# Patient Record
Sex: Male | Born: 2005 | Race: White | Hispanic: No | Marital: Single | Smoking: Never smoker
Health system: Southern US, Community
[De-identification: ages and names within clinical notes are randomized; demographics above are authoritative.]

## PROBLEM LIST (undated history)

## (undated) DIAGNOSIS — Q549 Hypospadias, unspecified: Secondary | ICD-10-CM

## (undated) DIAGNOSIS — E119 Type 2 diabetes mellitus without complications: Secondary | ICD-10-CM

## (undated) DIAGNOSIS — K219 Gastro-esophageal reflux disease without esophagitis: Secondary | ICD-10-CM

## (undated) DIAGNOSIS — J45909 Unspecified asthma, uncomplicated: Secondary | ICD-10-CM

## (undated) DIAGNOSIS — I10 Essential (primary) hypertension: Secondary | ICD-10-CM

## (undated) HISTORY — DX: Type 2 diabetes mellitus without complications: E11.9

## (undated) HISTORY — DX: Gastro-esophageal reflux disease without esophagitis: K21.9

## (undated) HISTORY — DX: Hypospadias, unspecified: Q54.9

## (undated) HISTORY — DX: Unspecified asthma, uncomplicated: J45.909

## (undated) HISTORY — DX: Essential (primary) hypertension: I10

---
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
## 2024-01-01 ENCOUNTER — Other Ambulatory Visit: Payer: Self-pay | Admitting: "Endocrinology

## 2024-01-01 NOTE — Telephone Encounter (Signed)
 Requested Prescriptions   Pending Prescriptions Disp Refills   JARDIANCE  10 MG TABS tablet [Pharmacy Med Name: Jardiance  10 MG Oral Tablet] 30 tablet 0    Sig: TAKE 1 TABLET BY MOUTH ONCE DAILY BEFORE BREAKFAST

## 2024-01-03 ENCOUNTER — Encounter: Payer: Self-pay | Admitting: "Endocrinology

## 2024-01-03 ENCOUNTER — Ambulatory Visit (INDEPENDENT_AMBULATORY_CARE_PROVIDER_SITE_OTHER): Admitting: "Endocrinology

## 2024-01-03 VITALS — BP 134/80 | HR 84 | Ht 72.0 in | Wt 307.0 lb

## 2024-01-03 DIAGNOSIS — R809 Proteinuria, unspecified: Secondary | ICD-10-CM

## 2024-01-03 DIAGNOSIS — E11649 Type 2 diabetes mellitus with hypoglycemia without coma: Secondary | ICD-10-CM

## 2024-01-03 DIAGNOSIS — Z794 Long term (current) use of insulin: Secondary | ICD-10-CM

## 2024-01-03 DIAGNOSIS — Z7985 Long-term (current) use of injectable non-insulin antidiabetic drugs: Secondary | ICD-10-CM

## 2024-01-03 DIAGNOSIS — E1129 Type 2 diabetes mellitus with other diabetic kidney complication: Secondary | ICD-10-CM

## 2024-01-03 DIAGNOSIS — Z7984 Long term (current) use of oral hypoglycemic drugs: Secondary | ICD-10-CM

## 2024-01-03 LAB — POCT GLYCOSYLATED HEMOGLOBIN (HGB A1C): Hemoglobin A1C: 6.2 % — AB (ref 4.0–5.6)

## 2024-01-03 NOTE — Progress Notes (Signed)
 Outpatient Endocrinology Note Wayne Birmingham, MD  01/03/24   Wayne Merritt 2005-07-01 [id number]  Referring Provider: Sirivol, Mamatha, MD Primary Care Provider: Sirivol, Mamatha, MD Reason for consultation: Subjective   Assessment & Plan  Diagnoses and all orders for this visit:  Type 2 diabetes mellitus with microalbuminuria (HCC) -     POCT glycosylated hemoglobin (Hb A1C)  Long term (current) use of oral hypoglycemic drugs  Long-term (current) use of injectable non-insulin antidiabetic drugs  Long-term insulin use (HCC)   Diabetes Type II complicated by hyperglycemia, microalbuminuria, No results found for: GFR Hba1c goal less than 7, current Hba1c is  Lab Results  Component Value Date   HGBA1C 6.2 (A) 01/03/2024   Will recommend the following: Jardiance  10mg  every day Metformin  1000 mg bid Ozempic  2 mg/week-starting weight was 359 lbs Toujeo 52 units in the morning and 52 units at night  Humalog sliding scale: >120: 5 units, goes up by 2 every 20 point: 15 min before meals   No known contraindications/side effects to any of above medications 07/20/23: Has Baqsimi, discussed use  -Last LD and Tg are as follows: Lab Results  Component Value Date   LDLCALC 74 04/14/2023    Lab Results  Component Value Date   TRIG 81 04/14/2023   -not on statin -Follow low fat diet and exercise   -Blood pressure goal <140/90 - Microalbumin/creatinine goal is < 30 -Last MA/Cr is as follows: 887 on 07/2023 No results found for: MICROALBUR, MALB24HUR -on ACE/ARB lisinopril  10 mg every day  -diet changes including salt restriction -limit eating outside -counseled BP targets per standards of diabetes care -uncontrolled blood pressure can lead to retinopathy, nephropathy and cardiovascular and atherosclerotic heart disease  Reviewed and counseled on: -A1C target -Blood sugar targets -Complications of uncontrolled diabetes  -Checking blood sugar before meals and  bedtime and bring log next visit -All medications with mechanism of action and side effects -Hypoglycemia management: rule of 15's, Glucagon  Emergency Kit and medical alert ID -low-carb low-fat plate-method diet -At least 20 minutes of physical activity per day -Annual dilated retinal eye exam and foot exam -compliance and follow up needs -follow up as scheduled or earlier if problem gets worse  Call if blood sugar is less than 70 or consistently above 250    Take a 15 gm snack of carbohydrate at bedtime before you go to sleep if your blood sugar is less than 100.    If you are going to fast after midnight for a test or procedure, ask your physician for instructions on how to reduce/decrease your insulin dose.    Call if blood sugar is less than 70 or consistently above 250  -Treating a low sugar by rule of 15  (15 gms of sugar every 15 min until sugar is more than 70) If you feel your sugar is low, test your sugar to be sure If your sugar is low (less than 70), then take 15 grams of a fast acting Carbohydrate (3-4 glucose tablets or glucose gel or 4 ounces of juice or regular soda) Recheck your sugar 15 min after treating low to make sure it is more than 70 If sugar is still less than 70, treat again with 15 grams of carbohydrate          Don't drive the hour of hypoglycemia  If unconscious/unable to eat or drink by mouth, use glucagon  injection or nasal spray baqsimi and call 911. Can repeat again in 15 min if  still unconscious.  Return in about 4 months (around 05/04/2024).   I have reviewed current medications, nurse's notes, allergies, vital signs, past medical and surgical history, family medical history, and social history for this encounter. Counseled patient on symptoms, examination findings, lab findings, imaging results, treatment decisions and monitoring and prognosis. The patient understood the recommendations and agrees with the treatment plan. All questions regarding  treatment plan were fully answered.  Wayne Birmingham, MD  01/03/24    History of Present Illness Wayne Merritt is a 18 y.o. 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  [name] was first diagnosed in 2020.   Diabetes education +  Home diabetes regimen: Jardiance  10mg  every day Ozempic  2 mg/week-starting weight was 359 lbs Metformin  1000 mg bid Toujeo 56 units bid Humalog sliding scale: 100-150: 5 units, 151-200: 7 units, 201-250: 9 units   07/20/23: Started jardiance  10mg  every day to help with microalbuminuria, discussed S/E, no current contraindications  COMPLICATIONS -  MI/Stroke -  retinopathy -  neuropathy - nephropathy/ + microalbuminuria   BLOOD SUGAR DATA CGM interpretation: At today's visit, we reviewed her CGM downloads. The full report is scanned in the media. Reviewing the CGM trends, BG are well controlled across the day with some highs.   Physical Exam  BP 134/80   Pulse 84   Ht 6' (1.829 m)   Wt (!) 307 lb (139.3 kg)   SpO2 96%   BMI 41.64 kg/m    Constitutional: well developed, well nourished Head: normocephalic, atraumatic Eyes: sclera anicteric, no redness Neck: supple Lungs: normal respiratory effort Neurology: alert and oriented Skin: dry, no appreciable rashes Musculoskeletal: no appreciable defects Psychiatric: normal mood and affect Diabetic Foot Exam - Simple   No data filed      Current Medications Patient's Medications  New Prescriptions   No medications on file  Previous Medications   BAQSIMI ONE PACK 3 MG/DOSE POWD    ADMINISTER 1 SPRAY INTO AFFECTED NOTRIL(S) AS NEEDED (USE IN HYPOGLYCEMIC EVENT).   CETIRIZINE  (ZYRTEC ) 10 MG TABLET    Take 1 tablet by mouth once daily   CONTINUOUS GLUCOSE SENSOR (DEXCOM G7 SENSOR) MISC    1 Device by Does not apply route continuous.   DUPIXENT 300 MG/2ML SOPN    Inject 1 each into the skin every 14 (fourteen) days.   EPINEPHRINE  0.3 MG/0.3 ML IJ SOAJ  INJECTION    Inject 0.3 mg into the muscle as needed for anaphylaxis.   ERGOCALCIFEROL  (VITAMIN D2) 1.25 MG (50000 UT) CAPSULE    Take 1 capsule (50,000 Units total) by mouth once a week. Takes two every Monday.   INSULIN GLARGINE, 1 UNIT DIAL, (TOUJEO SOLOSTAR) 300 UNIT/ML SOLOSTAR PEN    Inject 56 Units into the skin 2 (two) times daily.   INSULIN LISPRO (HUMALOG) 100 UNIT/ML KWIKPEN    Inject into the skin daily. Sliding scale   JARDIANCE  10 MG TABS TABLET    TAKE 1 TABLET BY MOUTH ONCE DAILY BEFORE BREAKFAST   LISINOPRIL  (ZESTRIL ) 10 MG TABLET    Take 10 mg by mouth daily.   METFORMIN  (GLUCOPHAGE ) 1000 MG TABLET    Take 1 tablet (1,000 mg total) by mouth 2 (two) times daily.   OZEMPIC , 2 MG/DOSE, 8 MG/3ML SOPN    INJECT 1 DOSE SUBCUTANEOUSLY ONCE A WEEK   PANTOPRAZOLE  (PROTONIX ) 40 MG TABLET    Take by mouth.   SULFAMETHOXAZOLE -TRIMETHOPRIM  (BACTRIM  DS) 800-160 MG TABLET  Take 1 tablet by mouth 2 (two) times daily.  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 No pertinent surgical history.  Family History family history includes COPD in his mother; Diabetes in his father, maternal grandmother, and mother; Heart failure in his mother; Hypertension in his maternal grandmother; Kidney disease in his mother; Mental illness in his mother; Obesity in his mother; Stroke in his maternal grandfather.  Social History Social History   Socioeconomic History   Marital status: Single    Spouse name: Not on file   Number of children: Not on file   Years of education: Not on file   Highest education level: 12th grade  Occupational History   Occupation: Consulting Civil Engineer  Tobacco Use    Smoking status: Never    Passive exposure: Never   Smokeless tobacco: Never  Substance and Sexual Activity   Alcohol use: Never   Drug use: Never   Sexual activity: Never  Other Topics Concern   Not on file  Social History Narrative   Not on file   Social Drivers of Health   Financial Resource Strain: Low Risk  (04/13/2023)   Overall Financial Resource Strain (CARDIA)    Difficulty of Paying Living Expenses: Not hard at all  Food Insecurity: No Food Insecurity (04/13/2023)   Hunger Vital Sign    Worried About Running Out of Food in the Last Year: Never true    Ran Out of Food in the Last Year: Never true  Transportation Needs: No Transportation Needs (04/13/2023)   PRAPARE - Administrator, Civil Service (Medical): No    Lack of Transportation (Non-Medical): No  Physical Activity: Insufficiently Active (04/13/2023)   Exercise Vital Sign    Days of Exercise per Week: 4 days    Minutes of Exercise per Session: 30 min  Stress: No Stress Concern Present (04/13/2023)   [employer] of Occupational Health - Occupational Stress Questionnaire    Feeling of Stress : Only a little  Social Connections: Socially Isolated (04/13/2023)   Social Connection and Isolation Panel    Frequency of Communication with Friends and Family: Twice a week    Frequency of Social Gatherings with Friends and Family: More than three times a week    Attends Religious Services: Never    Database Administrator or Organizations: No    Attends Engineer, Structural: Not on file    Marital Status: Never married  Intimate Partner Violence: Not At Risk (05/20/2021)   Humiliation, Afraid, Rape, and Kick questionnaire    Fear of Current or Ex-Partner: No    Emotionally Abused: No    Physically Abused: No    Sexually Abused: No    Lab Results  Component Value Date   HGBA1C 6.2 (A) 01/03/2024   HGBA1C 7.2 (H) 07/17/2023   HGBA1C 7.2 (H) 04/14/2023   Lab Results  Component Value Date   CHOL 134  04/14/2023   Lab Results  Component Value Date   HDL 44 04/14/2023   Lab Results  Component Value Date   LDLCALC 74 04/14/2023   Lab Results  Component Value Date  TRIG 81 04/14/2023   Lab Results  Component Value Date   CHOLHDL 3.0 04/14/2023   Lab Results  Component Value Date   CREATININE 0.67 (L) 04/14/2023   No results found for: GFR No results found for: MACKEY CURRENT    Component Value Date/Time   NA 139 04/14/2023 0959   K 4.6 04/14/2023 0959   CL 100 04/14/2023 0959   CO2 24 04/14/2023 0959   GLUCOSE 114 (H) 04/14/2023 0959   BUN 7 04/14/2023 0959   CREATININE 0.67 (L) 04/14/2023 0959   CALCIUM 9.8 04/14/2023 0959   PROT 7.1 04/14/2023 0959   ALBUMIN 4.7 04/14/2023 0959   AST 30 04/14/2023 0959   ALT 56 (H) 04/14/2023 0959   ALKPHOS 43 (L) 04/14/2023 0959   BILITOT 0.3 04/14/2023 0959      Latest Ref Rng & Units 04/14/2023    9:59 AM 01/02/2023    9:15 AM 11/19/2021   10:39 AM  BMP  Glucose 70 - 99 mg/dL 885  873  865   BUN 6 - 20 mg/dL 7  9  16    Creatinine 0.76 - 1.27 mg/dL 9.32  9.26  9.38   BUN/Creat Ratio 9 - 20 10  12  26    Sodium 134 - 144 mmol/L 139  140  139   Potassium 3.5 - 5.2 mmol/L 4.6  4.7  4.7   Chloride 96 - 106 mmol/L 100  99  103   CO2 20 - 29 mmol/L 24  22  21    Calcium 8.7 - 10.2 mg/dL 9.8  89.7  89.9        Component Value Date/Time   WBC 8.6 01/02/2023 0915   RBC 5.04 01/02/2023 0915   HGB 14.1 01/02/2023 0915   HCT 43.6 01/02/2023 0915   PLT 375 01/02/2023 0915   MCV 87 01/02/2023 0915   MCH 28.0 01/02/2023 0915   MCHC 32.3 01/02/2023 0915   RDW 13.2 01/02/2023 0915   LYMPHSABS 2.1 01/02/2023 0915   EOSABS 0.1 01/02/2023 0915   BASOSABS 0.1 01/02/2023 0915     Parts of this note may have been dictated using voice recognition software. There may be variances in spelling and vocabulary which are unintentional. Not all errors are proofread. Please notify the dino if any discrepancies are noted or if  the meaning of any statement is not clear.

## 2024-01-03 NOTE — Patient Instructions (Signed)

## 2024-01-05 ENCOUNTER — Encounter: Payer: Self-pay | Admitting: "Endocrinology

## 2024-01-15 ENCOUNTER — Encounter

## 2024-01-16 ENCOUNTER — Other Ambulatory Visit: Payer: Self-pay

## 2024-01-25 ENCOUNTER — Encounter

## 2024-02-02 ENCOUNTER — Other Ambulatory Visit: Payer: Self-pay | Admitting: "Endocrinology

## 2024-02-05 ENCOUNTER — Encounter

## 2024-02-05 NOTE — Telephone Encounter (Signed)
 Requested Prescriptions   Pending Prescriptions Disp Refills   JARDIANCE  10 MG TABS tablet [Pharmacy Med Name: Jardiance  10 MG Oral Tablet] 30 tablet 0    Sig: TAKE 1 TABLET BY MOUTH ONCE DAILY BEFORE BREAKFAST

## 2024-02-09 ENCOUNTER — Other Ambulatory Visit: Payer: Self-pay

## 2024-02-22 ENCOUNTER — Encounter

## 2024-02-27 ENCOUNTER — Other Ambulatory Visit: Payer: Self-pay

## 2024-03-02 ENCOUNTER — Other Ambulatory Visit: Payer: Self-pay | Admitting: "Endocrinology

## 2024-03-04 NOTE — Telephone Encounter (Signed)
 Requested Prescriptions   Pending Prescriptions Disp Refills   JARDIANCE  10 MG TABS tablet [Pharmacy Med Name: Jardiance  10 MG Oral Tablet] 30 tablet 0    Sig: TAKE 1 TABLET BY MOUTH ONCE DAILY BEFORE BREAKFAST

## 2024-03-09 ENCOUNTER — Other Ambulatory Visit: Payer: Self-pay

## 2024-03-18 ENCOUNTER — Encounter

## 2024-03-25 ENCOUNTER — Other Ambulatory Visit: Payer: Self-pay

## 2024-03-25 ENCOUNTER — Ambulatory Visit

## 2024-03-25 VITALS — BP 116/76 | HR 100 | Temp 97.5°F | Ht 72.02 in | Wt 317.0 lb

## 2024-03-25 DIAGNOSIS — E118 Type 2 diabetes mellitus with unspecified complications: Secondary | ICD-10-CM | POA: Insufficient documentation

## 2024-03-25 DIAGNOSIS — E66813 Obesity, class 3: Secondary | ICD-10-CM

## 2024-03-25 DIAGNOSIS — Z0000 Encounter for general adult medical examination without abnormal findings: Secondary | ICD-10-CM

## 2024-03-25 DIAGNOSIS — Z7984 Long term (current) use of oral hypoglycemic drugs: Secondary | ICD-10-CM | POA: Diagnosis not present

## 2024-03-25 DIAGNOSIS — Z7985 Long-term (current) use of injectable non-insulin antidiabetic drugs: Secondary | ICD-10-CM

## 2024-03-25 DIAGNOSIS — Z6841 Body Mass Index (BMI) 40.0 and over, adult: Secondary | ICD-10-CM

## 2024-03-25 DIAGNOSIS — Z794 Long term (current) use of insulin: Secondary | ICD-10-CM

## 2024-03-25 MED ORDER — BD PEN NEEDLE NANO ULTRAFINE 32G X 4 MM MISC: 2 refills | Status: AC

## 2024-03-25 NOTE — Telephone Encounter (Signed)
 Copied from CRM [id number]. Topic: Clinical - Medication Question >> Mar 25, 2024 10:09 AM Sophia H wrote: Reason for CRM: Patient states he is needing PCP to send in an RX for BD Nano pen needles - 32G/4ML. Had not needed prior because he had a big stock pile prescribed by another provider but is running low. Needing sent in ASAP please.   [facility] [address], [place] - [address]

## 2024-03-25 NOTE — Assessment & Plan Note (Signed)
 Wayne Merritt

## 2024-03-25 NOTE — Progress Notes (Unsigned)
 "  Subjective:  Patient ID: Wayne Merritt, male    DOB: [birth year]  Age: 19 y.o. MRN: [id number]  Chief Complaint  Patient presents with   Annual Exam   HPI: Discussed the use of AI scribe software for clinical note transcription with the patient, who gave verbal consent to proceed.      Well Adult Physical: Patient here for a comprehensive physical exam.The patient reports no problems Do you take any herbs or supplements that were not prescribed by a doctor? no Are you taking calcium supplements? no Are you taking aspirin daily? no  Encounter for general adult medical examination without abnormal findings  Physical (At Risk items are starred): Patient's last physical exam was 1 year ago .  Patient wears a seat belt, has smoke detectors, has carbon monoxide detectors, practices appropriate gun safety, and wears sunscreen with extended sun exposure. Dental Care: biannual cleanings, brushes and flosses daily. Ophthalmology/Optometry: Annual visit.  Hearing loss: none Vision impairments: none      03/25/2024    3:23 PM 07/17/2023    1:55 PM 04/25/2023    1:53 PM 04/14/2023    9:30 AM 01/16/2023    8:18 AM  Depression screen PHQ 2/9  Decreased Interest 0 0 0 0 0  Down, Depressed, Hopeless 0 0 0 0 0  PHQ - 2 Score 0 0 0 0 0  Altered sleeping  1  0 0  Tired, decreased energy  2  0 0  Change in appetite  0  0 1  Feeling bad or failure about yourself   0  0 0  Trouble concentrating  0  1 0  Moving slowly or fidgety/restless  0  0 0  Suicidal thoughts  0  0 0  PHQ-9 Score  3   1  1    Difficult doing work/chores  Not difficult at all  Somewhat difficult Somewhat difficult     Data saved with a previous flowsheet row definition         01/16/2023    8:17 AM 04/14/2023    9:30 AM 04/25/2023    1:53 PM 07/17/2023    1:55 PM 03/25/2024    3:23 PM  Fall Risk  Falls in the past year? 0 0 0 0 0  Was there an injury with Fall? 0  0  0  0  0  Fall Risk Category Calculator 0 0 0 0 0   Patient at Risk for Falls Due to No Fall Risks No Fall Risks No Fall Risks No Fall Risks No Fall Risks  Fall risk Follow up Falls evaluation completed Falls evaluation completed   Falls evaluation completed     Data saved with a previous flowsheet row definition              Past Medical History:  Diagnosis Date   Acid reflux    Asthma    DM II (diabetes mellitus, type II), controlled (HCC)    Hypertension    Hypospadias in male    History reviewed. No pertinent surgical history.  Family History  Problem Relation Age of Onset   Diabetes Mother    Kidney disease Mother    Mental illness Mother    Heart failure Mother    Obesity Mother    COPD Mother    Diabetes Father    Diabetes Maternal Grandmother    Hypertension Maternal Grandmother    Stroke Maternal Grandfather    Social History   Socioeconomic History  Marital status: Single    Spouse name: Not on file   Number of children: Not on file   Years of education: Not on file   Highest education level: 12th grade  Occupational History   Occupation: Student  Tobacco Use   Smoking status: Never    Passive exposure: Never   Smokeless tobacco: Never  Substance and Sexual Activity   Alcohol use: Never   Drug use: Never   Sexual activity: Never  Other Topics Concern   Not on file  Social History Narrative   Not on file   Social Drivers of Health   Tobacco Use: Low Risk (03/25/2024)   Patient History    Smoking Tobacco Use: Never    Smokeless Tobacco Use: Never    Passive Exposure: Never  Financial Resource Strain: Low Risk (04/13/2023)   Overall Financial Resource Strain (CARDIA)    Difficulty of Paying Living Expenses: Not hard at all  Food Insecurity: No Food Insecurity (04/13/2023)   Hunger Vital Sign    Worried About Running Out of Food in the Last Year: Never true    Ran Out of Food in the Last Year: Never true  Transportation Needs: No Transportation Needs (04/13/2023)    PRAPARE - Administrator, Civil Service (Medical): No    Lack of Transportation (Non-Medical): No  Physical Activity: Insufficiently Active (04/13/2023)   Exercise Vital Sign    Days of Exercise per Week: 4 days    Minutes of Exercise per Session: 30 min  Stress: No Stress Concern Present (04/13/2023)   [employer] of Occupational Health - Occupational Stress Questionnaire    Feeling of Stress : Only a little  Social Connections: Socially Isolated (04/13/2023)   Social Connection and Isolation Panel    Frequency of Communication with Friends and Family: Twice a week    Frequency of Social Gatherings with Friends and Family: More than three times a week    Attends Religious Services: Never    Database Administrator or Organizations: No    Attends Engineer, Structural: Not on file    Marital Status: Never married  Depression (PHQ2-9): Low Risk (03/25/2024)   Depression (PHQ2-9)    PHQ-2 Score: 0  Alcohol Screen: Low Risk (02/22/2022)   Alcohol Screen    Last Alcohol Screening Score (AUDIT): 0  Housing: Unknown (04/13/2023)   Housing Stability Vital Sign    Unable to Pay for Housing in the Last Year: Patient declined    Number of Times Moved in the Last Year: Not on file    Homeless in the Last Year: No  Utilities: Not At Risk (11/19/2021)   [employer] Utilities    Threatened with loss of utilities: No  Health Literacy: Not on file   Review of Systems  Constitutional:  Negative for chills, fatigue and fever.  HENT:  Negative for congestion, ear pain, sinus pressure and sore throat.   Respiratory:  Negative for cough and shortness of breath.   Cardiovascular:  Negative for chest pain.  Gastrointestinal:  Negative for abdominal pain, constipation, diarrhea, nausea and vomiting.  Genitourinary:  Negative for dysuria and frequency.  Musculoskeletal:  Negative for arthralgias, back pain and myalgias.  Neurological:  Negative for dizziness and headaches.   Psychiatric/Behavioral:  Negative for dysphoric mood. The patient is not nervous/anxious.      Objective:  BP 116/76   Pulse 100   Temp (!) 97.5 F (36.4 C)   Ht 6' 0.02 (1.829 m)  Wt (!) 317 lb (143.8 kg)   SpO2 98%   BMI 42.97 kg/m      03/25/2024    3:21 PM 01/03/2024    9:51 AM 11/17/2023    9:38 AM  BP/Weight  Systolic BP 116 134 122  Diastolic BP 76 80 88  Wt. (Lbs) 317 307 305  BMI 42.97 kg/m2 41.64 kg/m2 41.34 kg/m2    Physical Exam Vitals and nursing note reviewed.  Constitutional:      Appearance: He is obese.  HENT:     Head: Normocephalic and atraumatic.  Eyes:     Pupils: Pupils are equal, round, and reactive to light.  Cardiovascular:     Rate and Rhythm: Normal rate and regular rhythm.  Pulmonary:     Effort: Pulmonary effort is normal.     Breath sounds: Normal breath sounds.  Musculoskeletal:        General: Normal range of motion.  Skin:    General: Skin is warm.  Neurological:     General: No focal deficit present.     Mental Status: He is alert and oriented to person, place, and time.  Psychiatric:        Mood and Affect: Mood normal.     Lab Results  Component Value Date   WBC 8.6 01/02/2023   HGB 14.1 01/02/2023   HCT 43.6 01/02/2023   PLT 375 01/02/2023   GLUCOSE 114 (H) 04/14/2023   CHOL 134 04/14/2023   TRIG 81 04/14/2023   HDL 44 04/14/2023   LDLCALC 74 04/14/2023   ALT 56 (H) 04/14/2023   AST 30 04/14/2023   NA 139 04/14/2023   K 4.6 04/14/2023   CL 100 04/14/2023   CREATININE 0.67 (L) 04/14/2023   BUN 7 04/14/2023   CO2 24 04/14/2023   TSH 2.960 01/02/2023   HGBA1C 6.2 (A) 01/03/2024      Assessment & Plan:   Assessment & Plan Well adult exam     Controlled diabetes mellitus type 2 with complications (HCC)     Class 3 severe obesity due to excess calories with serious comorbidity and body mass index (BMI) of 40.0 to 44.9 in adult (HCC)        Body mass index is 42.97 kg/m.   These are the  goals we discussed:  Goals   None      This is a list of the screening recommended for you and due dates:  Health Maintenance  Topic Date Due   Hepatitis C Screening  Never done   Complete foot exam   01/06/2024   Yearly kidney function blood test for diabetes  04/13/2024   Eye exam for diabetics  03/25/2024*   COVID-19 Vaccine (9 - 2025-26 season) 04/10/2024*   Meningitis B Vaccine (1 of 2 - Standard) 03/25/2025*   Hemoglobin A1C  07/03/2024   Kidney health urinalysis for diabetes  07/16/2024   DTaP/Tdap/Td vaccine (7 - Td or Tdap) 09/17/2024   Pneumococcal Vaccine  Completed   Flu Shot  Completed   HPV Vaccine  Completed   Hepatitis B Vaccine  Discontinued   HIV Screening  Discontinued  *Topic was postponed. The date shown is not the original due date.     No orders of the defined types were placed in this encounter.    Follow-up: No follow-ups on file.  An After Visit Summary was printed and given to the patient.  Tommy Schimke, MD [facility] [phone]  "

## 2024-03-25 NOTE — Assessment & Plan Note (Signed)
 SABRA

## 2024-03-31 ENCOUNTER — Other Ambulatory Visit: Payer: Self-pay | Admitting: "Endocrinology

## 2024-05-06 ENCOUNTER — Ambulatory Visit: Admitting: "Endocrinology
